# Patient Record
Sex: Female | Born: 1990 | Race: White | Hispanic: No | Marital: Single | Smoking: Current every day smoker
Health system: Southern US, Community
[De-identification: ages and names within clinical notes are randomized; demographics above are authoritative.]

## PROBLEM LIST (undated history)

## (undated) DIAGNOSIS — G479 Sleep disorder, unspecified: Secondary | ICD-10-CM

## (undated) DIAGNOSIS — G629 Polyneuropathy, unspecified: Secondary | ICD-10-CM

---
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
## 2010-11-30 ENCOUNTER — Emergency Department: Payer: Self-pay | Admitting: Internal Medicine

## 2013-06-17 ENCOUNTER — Emergency Department: Payer: Self-pay | Admitting: Emergency Medicine

## 2013-06-17 LAB — COMPREHENSIVE METABOLIC PANEL
Albumin: 3.6 g/dL (ref 3.4–5.0)
Alkaline Phosphatase: 52 U/L
Anion Gap: 5 — ABNORMAL LOW (ref 7–16)
BUN: 13 mg/dL (ref 7–18)
Bilirubin,Total: 0.1 mg/dL — ABNORMAL LOW (ref 0.2–1.0)
Calcium, Total: 9 mg/dL (ref 8.5–10.1)
Chloride: 108 mmol/L — ABNORMAL HIGH (ref 98–107)
Co2: 26 mmol/L (ref 21–32)
Creatinine: 0.78 mg/dL (ref 0.60–1.30)
EGFR (African American): >60
EGFR (Non-African Amer.): >60
Glucose: 94 mg/dL (ref 65–99)
Osmolality: 277 (ref 275–301)
Potassium: 3.6 mmol/L (ref 3.5–5.1)
SGOT(AST): 19 U/L (ref 15–37)
SGPT (ALT): 14 U/L (ref 12–78)
Sodium: 139 mmol/L (ref 136–145)
Total Protein: 7.4 g/dL (ref 6.4–8.2)

## 2013-06-17 LAB — URINALYSIS, COMPLETE
Bilirubin,UR: NEGATIVE
Glucose,UR: NEGATIVE mg/dL (ref 0–75)
Ketone: NEGATIVE
Leukocyte Esterase: NEGATIVE
NITRITE: POSITIVE
PROTEIN: NEGATIVE
Ph: 5 (ref 4.5–8.0)
Specific Gravity: 1.028 (ref 1.003–1.030)
WBC UR: 3 /HPF (ref 0–5)

## 2013-06-17 LAB — LIPASE, BLOOD: Lipase: 132 U/L (ref 73–393)

## 2013-06-17 LAB — CBC WITH DIFFERENTIAL/PLATELET
Basophil #: 0.1 10*3/uL (ref 0.0–0.1)
Basophil %: 0.6 %
Eosinophil #: 0.2 10*3/uL (ref 0.0–0.7)
Eosinophil %: 2 %
HCT: 41.8 % (ref 35.0–47.0)
HGB: 13.9 g/dL (ref 12.0–16.0)
Lymphocyte #: 3.4 10*3/uL (ref 1.0–3.6)
Lymphocyte %: 31.6 %
MCH: 29.6 pg (ref 26.0–34.0)
MCHC: 33.2 g/dL (ref 32.0–36.0)
MCV: 89 fL (ref 80–100)
Monocyte #: 0.8 x10 3/mm (ref 0.2–0.9)
Monocyte %: 7.3 %
Neutrophil #: 6.4 10*3/uL (ref 1.4–6.5)
Neutrophil %: 58.5 %
Platelet: 312 10*3/uL (ref 150–440)
RBC: 4.68 10*6/uL (ref 3.80–5.20)
RDW: 12.7 % (ref 11.5–14.5)
WBC: 10.9 10*3/uL (ref 3.6–11.0)

## 2013-06-20 LAB — URINE CULTURE

## 2013-12-14 ENCOUNTER — Emergency Department: Payer: Self-pay | Admitting: Emergency Medicine

## 2013-12-14 LAB — URINALYSIS, COMPLETE
Bacteria: NONE SEEN
RBC,UR: 2 /HPF (ref 0–5)
SPECIFIC GRAVITY: 1.029 (ref 1.003–1.030)
Squamous Epithelial: 3

## 2013-12-14 LAB — COMPREHENSIVE METABOLIC PANEL
ALBUMIN: 3.6 g/dL (ref 3.4–5.0)
ALK PHOS: 71 U/L
AST: 23 U/L (ref 15–37)
Anion Gap: 10 (ref 7–16)
BUN: 12 mg/dL (ref 7–18)
Bilirubin,Total: 0.3 mg/dL (ref 0.2–1.0)
Calcium, Total: 8.6 mg/dL (ref 8.5–10.1)
Chloride: 107 mmol/L (ref 98–107)
Co2: 24 mmol/L (ref 21–32)
Creatinine: 0.96 mg/dL (ref 0.60–1.30)
EGFR (African American): >60
Glucose: 85 mg/dL (ref 65–99)
Osmolality: 280 (ref 275–301)
Potassium: 3.7 mmol/L (ref 3.5–5.1)
SGPT (ALT): 22 U/L
Sodium: 141 mmol/L (ref 136–145)
TOTAL PROTEIN: 7.5 g/dL (ref 6.4–8.2)

## 2013-12-14 LAB — CBC
HCT: 41.8 % (ref 35.0–47.0)
HGB: 14.2 g/dL (ref 12.0–16.0)
MCH: 30.3 pg (ref 26.0–34.0)
MCHC: 34 g/dL (ref 32.0–36.0)
MCV: 89 fL (ref 80–100)
Platelet: 323 10*3/uL (ref 150–440)
RBC: 4.69 10*6/uL (ref 3.80–5.20)
RDW: 12.8 % (ref 11.5–14.5)
WBC: 15.3 10*3/uL — AB (ref 3.6–11.0)

## 2014-02-09 ENCOUNTER — Emergency Department: Payer: Self-pay | Admitting: Emergency Medicine

## 2014-02-09 LAB — CBC WITH DIFFERENTIAL/PLATELET
BASOS ABS: 0.1 10*3/uL (ref 0.0–0.1)
Basophil %: 0.5 %
Eosinophil #: 0.3 10*3/uL (ref 0.0–0.7)
Eosinophil %: 2.7 %
HCT: 41.7 % (ref 35.0–47.0)
HGB: 14.3 g/dL (ref 12.0–16.0)
Lymphocyte #: 3.7 10*3/uL — ABNORMAL HIGH (ref 1.0–3.6)
Lymphocyte %: 29.2 %
MCH: 30.5 pg (ref 26.0–34.0)
MCHC: 34.2 g/dL (ref 32.0–36.0)
MCV: 89 fL (ref 80–100)
MONO ABS: 0.8 x10 3/mm (ref 0.2–0.9)
MONOS PCT: 6.5 %
Neutrophil #: 7.8 10*3/uL — ABNORMAL HIGH (ref 1.4–6.5)
Neutrophil %: 61.1 %
PLATELETS: 340 10*3/uL (ref 150–440)
RBC: 4.68 10*6/uL (ref 3.80–5.20)
RDW: 12.7 % (ref 11.5–14.5)
WBC: 12.7 10*3/uL — AB (ref 3.6–11.0)

## 2014-02-09 LAB — COMPREHENSIVE METABOLIC PANEL
ALT: 26 U/L
Albumin: 3.4 g/dL (ref 3.4–5.0)
Alkaline Phosphatase: 66 U/L
Anion Gap: 9 (ref 7–16)
BILIRUBIN TOTAL: 0.3 mg/dL (ref 0.2–1.0)
BUN: 11 mg/dL (ref 7–18)
CO2: 26 mmol/L (ref 21–32)
Calcium, Total: 9 mg/dL (ref 8.5–10.1)
Chloride: 106 mmol/L (ref 98–107)
Creatinine: 0.69 mg/dL (ref 0.60–1.30)
EGFR (African American): >60
Glucose: 97 mg/dL (ref 65–99)
Osmolality: 281 (ref 275–301)
POTASSIUM: 3.6 mmol/L (ref 3.5–5.1)
SGOT(AST): 23 U/L (ref 15–37)
Sodium: 141 mmol/L (ref 136–145)
Total Protein: 7.5 g/dL (ref 6.4–8.2)

## 2014-02-09 LAB — URINALYSIS, COMPLETE
Bilirubin,UR: NEGATIVE
Glucose,UR: NEGATIVE mg/dL (ref 0–75)
Ketone: NEGATIVE
Leukocyte Esterase: NEGATIVE
Nitrite: NEGATIVE
PH: 6 (ref 4.5–8.0)
PROTEIN: NEGATIVE
RBC,UR: 1 /HPF (ref 0–5)
SPECIFIC GRAVITY: 1.02 (ref 1.003–1.030)
Squamous Epithelial: 17

## 2014-02-09 LAB — HCG, QUANTITATIVE, PREGNANCY: Beta Hcg, Quant.: 369 m[IU]/mL — ABNORMAL HIGH

## 2014-02-10 LAB — URINE CULTURE

## 2014-11-26 ENCOUNTER — Emergency Department: Payer: BLUE CROSS/BLUE SHIELD

## 2014-11-26 ENCOUNTER — Emergency Department
Admission: EM | Admit: 2014-11-26 | Discharge: 2014-11-26 | Disposition: A | Discharge status: Home / Self Care | Payer: BLUE CROSS/BLUE SHIELD | Attending: Emergency Medicine | Admitting: Emergency Medicine

## 2014-11-26 ENCOUNTER — Encounter: Payer: Self-pay | Admitting: *Deleted

## 2014-11-26 DIAGNOSIS — N390 Urinary tract infection, site not specified: Secondary | ICD-10-CM | POA: Insufficient documentation

## 2014-11-26 DIAGNOSIS — Z87891 Personal history of nicotine dependence: Secondary | ICD-10-CM | POA: Insufficient documentation

## 2014-11-26 DIAGNOSIS — Z3202 Encounter for pregnancy test, result negative: Secondary | ICD-10-CM | POA: Insufficient documentation

## 2014-11-26 DIAGNOSIS — N12 Tubulo-interstitial nephritis, not specified as acute or chronic: Secondary | ICD-10-CM | POA: Insufficient documentation

## 2014-11-26 LAB — BASIC METABOLIC PANEL
Anion gap: 5 (ref 5–15)
BUN: 12 mg/dL (ref 6–20)
CHLORIDE: 106 mmol/L (ref 101–111)
CO2: 29 mmol/L (ref 22–32)
CREATININE: 0.76 mg/dL (ref 0.44–1.00)
Calcium: 9 mg/dL (ref 8.9–10.3)
GFR calc Af Amer: >60 mL/min (ref >60)
GFR calc non Af Amer: >60 mL/min (ref >60)
GLUCOSE: 91 mg/dL (ref 65–99)
Potassium: 4.1 mmol/L (ref 3.5–5.1)
Sodium: 140 mmol/L (ref 135–145)

## 2014-11-26 LAB — CBC
HCT: 43.3 % (ref 35.0–47.0)
Hemoglobin: 14.6 g/dL (ref 12.0–16.0)
MCH: 29.4 pg (ref 26.0–34.0)
MCHC: 33.6 g/dL (ref 32.0–36.0)
MCV: 87.5 fL (ref 80.0–100.0)
PLATELETS: 264 10*3/uL (ref 150–440)
RBC: 4.95 MIL/uL (ref 3.80–5.20)
RDW: 12.9 % (ref 11.5–14.5)
WBC: 9.3 10*3/uL (ref 3.6–11.0)

## 2014-11-26 LAB — URINALYSIS COMPLETE WITH MICROSCOPIC (ARMC ONLY)
BILIRUBIN URINE: NEGATIVE
Bacteria, UA: NONE SEEN
Glucose, UA: NEGATIVE mg/dL
Ketones, ur: NEGATIVE mg/dL
NITRITE: NEGATIVE
Protein, ur: NEGATIVE mg/dL
Specific Gravity, Urine: 1.019 (ref 1.005–1.030)
pH: 5 (ref 5.0–8.0)

## 2014-11-26 LAB — POCT PREGNANCY, URINE: Preg Test, Ur: NEGATIVE

## 2014-11-26 MED ORDER — CEPHALEXIN 500 MG PO CAPS: 500.0000 mg | ORAL_CAPSULE | Freq: Three times a day (TID) | ORAL | Status: DC

## 2014-11-26 MED ORDER — ONDANSETRON 4 MG PO TBDP
4.0000 mg | ORAL_TABLET | Freq: Once | ORAL | Status: AC
  Administered 2014-11-26: 4 mg via ORAL

## 2014-11-26 MED ORDER — OXYCODONE-ACETAMINOPHEN 5-325 MG PO TABS: 1.0000 | ORAL_TABLET | Freq: Once | ORAL | Status: DC

## 2014-11-26 MED ORDER — OXYCODONE-ACETAMINOPHEN 5-325 MG PO TABS
ORAL_TABLET | ORAL | Status: AC
  Filled 2014-11-26: qty 2

## 2014-11-26 MED ORDER — OXYCODONE-ACETAMINOPHEN 5-325 MG PO TABS
2.0000 | ORAL_TABLET | Freq: Once | ORAL | Status: AC
  Administered 2014-11-26: 2 via ORAL

## 2014-11-26 MED ORDER — OXYCODONE-ACETAMINOPHEN 5-325 MG PO TABS
1.0000 | ORAL_TABLET | Freq: Once | ORAL | Status: AC
  Administered 2014-11-26: 1 via ORAL
  Filled 2014-11-26: qty 1

## 2014-11-26 MED ORDER — CEPHALEXIN 500 MG PO CAPS
500.0000 mg | ORAL_CAPSULE | Freq: Once | ORAL | Status: AC
  Administered 2014-11-26: 500 mg via ORAL
  Filled 2014-11-26 (×2): qty 1

## 2014-11-26 MED ORDER — ONDANSETRON 4 MG PO TBDP
ORAL_TABLET | ORAL | Status: AC
  Filled 2014-11-26: qty 1

## 2014-11-26 NOTE — ED Provider Notes (Signed)
[facility] Emergency Department Provider Note  ____________________________________________  Time seen: 1600  I have reviewed the triage vital signs and the nursing notes.   HISTORY  Chief Complaint Flank Pain     HPI Diana Eaton is a 24 y.o. female presents to the emergency department with right flank pain. She reports she started to have some dysuria on Sunday and some discomfort in her flank on Monday. She was seen yesterday at a "[facility]"at [employer] where she works. She was prescribed ciprofloxacin. She has taken 3 doses of this antibiotic, but she reports her pain has been getting worse, notably at around 9:30 this morning.  She denies any nausea or diarrhea. She denies any fever. She does have a history of kidney stones, notably last tear. She reports she was seen at [facility] and it sounds like she had a CT scan at that time.    Past medical history: Renal stones  There are no active problems to display for this patient.   Surgical history: Status post cholecystectomy and knee surgery  Current Outpatient Rx  Name  Route  Sig  Dispense  Refill  . cephALEXin (KEFLEX) 500 MG capsule   Oral   Take 1 capsule (500 mg total) by mouth 3 (three) times daily.   21 capsule   0   . oxyCODONE-acetaminophen (PERCOCET/ROXICET) 5-325 MG tablet   Oral   Take 1 tablet by mouth once.   10 tablet   0     Allergies Morphine and related  No family history on file.  Social History Social History  Substance Use Topics  . Smoking status: Former Games developer  . Smokeless tobacco: None  . Alcohol Use: No    Review of Systems  Constitutional: Negative for fever. ENT: Negative for sore throat. Cardiovascular: Negative for chest pain. Respiratory: Negative for cough. Gastrointestinal: Right flank pain. See history of present illness. Genitourinary: Positive for dysuria. See history of present illness Musculoskeletal: No myalgias or injuries. Skin:  Negative for rash. Neurological: Negative for paresthesia or weakness   10-point ROS otherwise negative.  ____________________________________________   PHYSICAL EXAM:  VITAL SIGNS: ED Triage Vitals  Enc Vitals Group     BP 11/26/14 1505 148/101 mmHg     Pulse Rate 11/26/14 1505 87     Resp 11/26/14 1505 20     Temp 11/26/14 1505 97.8 F (36.6 C)     Temp Source 11/26/14 1505 Oral     SpO2 11/26/14 1505 99 %     Weight 11/26/14 1505 285 lb (129.275 kg)     Height 11/26/14 1505  (1.778 m)     Head Cir --      Peak Flow --      Pain Score 11/26/14 1506 8     Pain Loc --      Pain Edu? --      Excl. in GC? --     Constitutional: Alert and oriented. Appears uncomfortable with some discomfort with movement.Marland Kitchen ENT   Head: Normocephalic and atraumatic.   Nose: No congestion/rhinnorhea.    Cardiovascular: Normal rate, regular rhythm, no murmur noted Respiratory:  Normal respiratory effort, no tachypnea.    Breath sounds are clear and equal bilaterally.  Gastrointestinal: Tenderness in the right abdomen, both upper and lower, but worse in the lower area with tenderness near McBurney's point. Abdomen is soft without distention. Normal bowel sounds.  Back: Pain in right paraspinal muscles, lower thoracic and upper lumbar area. Some pain  with movement. No pain radiating into the buttocks or leg.. Musculoskeletal: No deformity noted. Nontender with normal range of motion in all extremities.  No noted edema. Neurologic:  Normal speech and language. No gross focal neurologic deficits are appreciated.  Skin:  Skin is warm, dry. No rash noted. Psychiatric: Mood and affect are normal. Speech and behavior are normal.  ____________________________________________    LABS (pertinent positives/negatives)  Labs Reviewed  URINALYSIS COMPLETEWITH MICROSCOPIC ([facility] ONLY) - Abnormal; Notable for the following:    Color, Urine YELLOW (*)    APPearance CLOUDY (*)    Hgb urine  dipstick 3+ (*)    Leukocytes, UA TRACE (*)    Squamous Epithelial / LPF 6-30 (*)    All other components within normal limits  URINE CULTURE  BASIC METABOLIC PANEL  CBC  POC URINE PREG, ED  POCT PREGNANCY, URINE     ____________________________________________    RADIOLOGY  CT abdomen and pelvis:  IMPRESSION: 1. There is no nephrolithiasis. No hydronephrosis or hydroureter. 2. No calcified ureteral calculi. 3. Normal appendix. No pericecal inflammation. 4. No small bowel obstruction. 5. Status post cholecystectomy.  ____________________________________________   ____________________________________________   INITIAL IMPRESSION / ASSESSMENT AND PLAN / ED COURSE  Pertinent labs & imaging results that were available during my care of the patient were reviewed by me and considered in my medical decision making (see chart for details).  Pleasant alert 24 year old female who appears to be in discomfort. The exam shows muscular tenderness in the right paraspinal muscles, but also abdominal tenderness, most tender and exquisite over McBurney's point. The differential diagnosis includes renal stone, renal infection, or appendicitis. The patient and I have discussed options and agreed that a CT scan is appropriate. We will perform this as a renal stone protocol.  I will treat her pain currently with oral medications.  ----------------------------------------- 5:39 PM on 11/26/2014 -----------------------------------------  CT scan does not show any nephrolithiasis. She has a normal-appearing appendix. The urine does show white blood cells too numerous to count and red blood cells too numerous to count. Her renal function is normal and her white blood cell count is normal at 9.3  Given the overall normal CT scan and lab values, I suspect the patient has a mild pyelonephritis. We will culture the urine and had on Keflex for broad coverage. I advised that she needs to follow-up  with a urologist given the possible complexity of her condition.  ____________________________________________   FINAL CLINICAL IMPRESSION(S) / ED DIAGNOSES  Final diagnoses:  UTI (lower urinary tract infection)  Pyelonephritis      Darien Ramusavid W Samani Deal, MD 11/26/14 1745

## 2014-11-26 NOTE — ED Notes (Signed)
Pt is currently being treated for UTI. States she has had three doses of Cipro. Reports flank pain is worse than yesterday.

## 2014-11-26 NOTE — ED Notes (Signed)
poct pregnancy Negative 

## 2014-11-26 NOTE — Discharge Instructions (Signed)
Discharges blower for a kidney infection. It is unclear if your infection is limited the bladder or his climbing higher to the kidney. Your CT scan was okay and did not show any inflammation consistent with a bad kidney infection. Your CT also was negative for a kidney stone and negative for appendicitis. We are adding to the antibiotics are currently taking. Take Keflex as well as the ciprofloxacin that you are currently taking. Your urine shows many many white blood cells and many many red blood cells. Follow-up with urology or your regular doctor. Return to the emergency department if you have fever, worsening pain, or other urgent concerns.  Pyelonephritis, Adult Pyelonephritis is a kidney infection. The kidneys are organs that help clean your blood by moving waste out of your blood and into your pee (urine). This infection can happen quickly, or it can last for a long time. In most cases, it clears up with treatment and does not cause other problems. HOME CARE Medicines  Take over-the-counter and prescription medicines only as told by your doctor.  Take your antibiotic medicine as told by your doctor. Do not stop taking the medicine even if you start to feel better. General Instructions  Drink enough fluid to keep your pee clear or pale yellow.  Avoid caffeine, tea, and carbonated drinks.  Pee (urinate) often. Avoid holding in pee for long periods of time.  Pee before and after sex.  After pooping (having a bowel movement), women should wipe from front to back. Use each tissue only once.  Keep all follow-up visits as told by your doctor. This is important. GET HELP IF:  You do not feel better after 2 days.  Your symptoms get worse.  You have a fever. GET HELP RIGHT AWAY IF:  You cannot take your medicine or drink fluids as told.  You have chills and shaking.  You throw up (vomit).  You have very bad pain in your side (flank) or back.  You feel very weak or you pass out  (faint).   This information is not intended to replace advice given to you by your health care provider. Make sure you discuss any questions you have with your health care provider.   Document Released: 03/10/2004 Document Revised: 10/22/2014 Document Reviewed: 05/26/2014 Elsevier Interactive Patient Education [employer]2016 Elsevier Inc.

## 2014-11-26 NOTE — ED Notes (Addendum)
Pt has pain in right flank for 4 days.  Hx of kidney stones.  No n/v/d.  Taking percocet for pain last night, none today.  Seen by a provider yesterday, dx with uti and now on cipro.

## 2014-11-30 LAB — URINE CULTURE: CULTURE: NO GROWTH

## 2015-05-07 ENCOUNTER — Encounter: Payer: Self-pay | Admitting: Emergency Medicine

## 2015-05-07 ENCOUNTER — Emergency Department: Payer: 59

## 2015-05-07 ENCOUNTER — Emergency Department
Admission: EM | Admit: 2015-05-07 | Discharge: 2015-05-07 | Disposition: A | Discharge status: Home / Self Care | Payer: 59 | Attending: Emergency Medicine | Admitting: Emergency Medicine

## 2015-05-07 DIAGNOSIS — B9689 Other specified bacterial agents as the cause of diseases classified elsewhere: Secondary | ICD-10-CM

## 2015-05-07 DIAGNOSIS — N760 Acute vaginitis: Secondary | ICD-10-CM | POA: Insufficient documentation

## 2015-05-07 DIAGNOSIS — Z3202 Encounter for pregnancy test, result negative: Secondary | ICD-10-CM | POA: Insufficient documentation

## 2015-05-07 DIAGNOSIS — Z792 Long term (current) use of antibiotics: Secondary | ICD-10-CM | POA: Diagnosis not present

## 2015-05-07 DIAGNOSIS — R1032 Left lower quadrant pain: Secondary | ICD-10-CM

## 2015-05-07 DIAGNOSIS — F17200 Nicotine dependence, unspecified, uncomplicated: Secondary | ICD-10-CM | POA: Insufficient documentation

## 2015-05-07 DIAGNOSIS — R1031 Right lower quadrant pain: Secondary | ICD-10-CM

## 2015-05-07 DIAGNOSIS — R102 Pelvic and perineal pain: Secondary | ICD-10-CM | POA: Diagnosis present

## 2015-05-07 HISTORY — DX: Sleep disorder, unspecified: G47.9

## 2015-05-07 HISTORY — DX: Polyneuropathy, unspecified: G62.9

## 2015-05-07 LAB — URINALYSIS COMPLETE WITH MICROSCOPIC (ARMC ONLY)
Bilirubin Urine: NEGATIVE
GLUCOSE, UA: NEGATIVE mg/dL
Ketones, ur: NEGATIVE mg/dL
Leukocytes, UA: NEGATIVE
NITRITE: NEGATIVE
Protein, ur: NEGATIVE mg/dL
SPECIFIC GRAVITY, URINE: 1.023 (ref 1.005–1.030)
pH: 5 (ref 5.0–8.0)

## 2015-05-07 LAB — CHLAMYDIA/NGC RT PCR (ARMC ONLY)
Chlamydia Tr: NOT DETECTED
N GONORRHOEAE: NOT DETECTED

## 2015-05-07 LAB — CBC
HEMATOCRIT: 41.1 % (ref 35.0–47.0)
Hemoglobin: 14.4 g/dL (ref 12.0–16.0)
MCH: 30.3 pg (ref 26.0–34.0)
MCHC: 35 g/dL (ref 32.0–36.0)
MCV: 86.5 fL (ref 80.0–100.0)
Platelets: 294 10*3/uL (ref 150–440)
RBC: 4.75 MIL/uL (ref 3.80–5.20)
RDW: 13.1 % (ref 11.5–14.5)
WBC: 10.7 10*3/uL (ref 3.6–11.0)

## 2015-05-07 LAB — COMPREHENSIVE METABOLIC PANEL
ALBUMIN: 4.2 g/dL (ref 3.5–5.0)
ALK PHOS: 46 U/L (ref 38–126)
ALT: 16 U/L (ref 14–54)
AST: 27 U/L (ref 15–41)
Anion gap: 8 (ref 5–15)
BILIRUBIN TOTAL: 0.5 mg/dL (ref 0.3–1.2)
BUN: 13 mg/dL (ref 6–20)
CALCIUM: 9.1 mg/dL (ref 8.9–10.3)
CO2: 21 mmol/L — ABNORMAL LOW (ref 22–32)
CREATININE: 0.72 mg/dL (ref 0.44–1.00)
Chloride: 108 mmol/L (ref 101–111)
GFR calc Af Amer: >60 mL/min (ref >60)
GFR calc non Af Amer: >60 mL/min (ref >60)
GLUCOSE: 122 mg/dL — AB (ref 65–99)
POTASSIUM: 3.5 mmol/L (ref 3.5–5.1)
Sodium: 137 mmol/L (ref 135–145)
TOTAL PROTEIN: 7.5 g/dL (ref 6.5–8.1)

## 2015-05-07 LAB — WET PREP, GENITAL
Sperm: NONE SEEN
Trich, Wet Prep: NONE SEEN
YEAST WET PREP: NONE SEEN

## 2015-05-07 LAB — LIPASE, BLOOD: Lipase: 29 U/L (ref 11–51)

## 2015-05-07 LAB — POCT PREGNANCY, URINE: PREG TEST UR: NEGATIVE

## 2015-05-07 MED ORDER — METRONIDAZOLE 500 MG PO TABS: 500.0000 mg | ORAL_TABLET | Freq: Two times a day (BID) | ORAL | Status: DC

## 2015-05-07 MED ORDER — HYDROMORPHONE HCL 1 MG/ML IJ SOLN
INTRAMUSCULAR | Status: AC
Start: 2015-05-07 — End: 2015-05-07
  Administered 2015-05-07: 1 mg via INTRAVENOUS
  Filled 2015-05-07: qty 1

## 2015-05-07 MED ORDER — HYDROMORPHONE HCL 1 MG/ML IJ SOLN
1.0000 mg | Freq: Once | INTRAMUSCULAR | Status: AC
  Administered 2015-05-07: 1 mg via INTRAVENOUS

## 2015-05-07 MED ORDER — HYDROMORPHONE HCL 1 MG/ML IJ SOLN
INTRAMUSCULAR | Status: AC
  Administered 2015-05-07: 1 mg via INTRAVENOUS
  Filled 2015-05-07: qty 1

## 2015-05-07 MED ORDER — KETOROLAC TROMETHAMINE 30 MG/ML IJ SOLN
INTRAMUSCULAR | Status: AC
  Administered 2015-05-07: 30 mg via INTRAVENOUS
  Filled 2015-05-07: qty 1

## 2015-05-07 MED ORDER — OXYCODONE-ACETAMINOPHEN 5-325 MG PO TABS
1.0000 | ORAL_TABLET | Freq: Once | ORAL | Status: AC
  Administered 2015-05-07: 1 via ORAL
  Filled 2015-05-07: qty 1

## 2015-05-07 MED ORDER — DIATRIZOATE MEGLUMINE & SODIUM 66-10 % PO SOLN
25.0000 mL | Freq: Once | ORAL | Status: AC
  Administered 2015-05-07: 15 mL via ORAL

## 2015-05-07 MED ORDER — KETOROLAC TROMETHAMINE 30 MG/ML IJ SOLN
30.0000 mg | Freq: Once | INTRAMUSCULAR | Status: AC
  Administered 2015-05-07: 30 mg via INTRAVENOUS

## 2015-05-07 MED ORDER — OXYCODONE-ACETAMINOPHEN 5-325 MG PO TABS: 1.0000 | ORAL_TABLET | Freq: Four times a day (QID) | ORAL | Status: DC | PRN

## 2015-05-07 MED ORDER — METRONIDAZOLE 500 MG PO TABS
500.0000 mg | ORAL_TABLET | Freq: Once | ORAL | Status: AC
  Administered 2015-05-07: 500 mg via ORAL
  Filled 2015-05-07: qty 1

## 2015-05-07 MED ORDER — ONDANSETRON 8 MG PO TBDP
8.0000 mg | ORAL_TABLET | Freq: Three times a day (TID) | ORAL | Status: DC | PRN
Start: 2015-05-07 — End: 2019-01-14

## 2015-05-07 MED ORDER — IOPAMIDOL (ISOVUE-300) INJECTION 61%
100.0000 mL | Freq: Once | INTRAVENOUS | Status: AC | PRN
  Administered 2015-05-07: 100 mL via INTRAVENOUS
  Filled 2015-05-07: qty 100

## 2015-05-07 MED ORDER — SODIUM CHLORIDE 0.9 % IV BOLUS (SEPSIS)
1000.0000 mL | Freq: Once | INTRAVENOUS | Status: AC
  Administered 2015-05-07: 1000 mL via INTRAVENOUS

## 2015-05-07 NOTE — ED Notes (Signed)
Patient transported to Ultrasound 

## 2015-05-07 NOTE — ED Notes (Signed)
Patient transported to CT 

## 2015-05-07 NOTE — ED Provider Notes (Signed)
[facility] Emergency Department Provider Note  ____________________________________________  Time seen: 6:40 PM  I have reviewed the triage vital signs and the nursing notes.   HISTORY  Chief Complaint Pelvic Pain    HPI Diana Eaton is a 25 y.o. female who complains of severe right-sided pelvic pain for the past 4 days. No nausea vomiting or diarrhea. She hasn't had some vaginal discharge for the past 2 days with some light bleeding. Pain is been constant and gradually worsening. Nonradiating. No aggravating or alleviating factors although it does hurt with she moves. History of cholecystectomy. Was recently completed a course of Keflex.Pain is sharp and severe.     Past Medical History  Diagnosis Date  . Neuropathy (HCC)   . Sleep disturbance      There are no active problems to display for this patient.    History reviewed. No pertinent past surgical history. Cholecystectomy  Current Outpatient Rx  Name  Route  Sig  Dispense  Refill  . cephALEXin (KEFLEX) 500 MG capsule   Oral   Take 1 capsule (500 mg total) by mouth 3 (three) times daily.   21 capsule   0   . oxyCODONE-acetaminophen (PERCOCET/ROXICET) 5-325 MG tablet   Oral   Take 1 tablet by mouth once.   10 tablet   0      Allergies Morphine and related   History reviewed. No pertinent family history.  Social History Social History  Substance Use Topics  . Smoking status: Current Every Day Smoker  . Smokeless tobacco: None  . Alcohol Use: No    Review of Systems  Constitutional:   No fever or chills. No weight changes Eyes:   No vision changes.  ENT:   No sore throat. No rhinorrhea. Cardiovascular:   No chest pain. Respiratory:   No dyspnea or cough. Gastrointestinal:   Positive right lower quadrant abdominal pain.Marland Kitchen.  No BRBPR or melena. Genitourinary:   Negative for dysuria or difficulty urinating. Musculoskeletal:   Negative for focal pain or  swelling Skin:   Negative for rash. Neurological:   Negative for headaches, focal weakness or numbness.  10-point ROS otherwise negative.  ____________________________________________   PHYSICAL EXAM:  VITAL SIGNS: ED Triage Vitals  Enc Vitals Group     BP 05/07/15 1427 127/91 mmHg     Pulse Rate 05/07/15 1427 97     Resp 05/07/15 1427 18     Temp 05/07/15 1427 98.7 F (37.1 C)     Temp Source 05/07/15 1427 Oral     SpO2 05/07/15 1427 96 %     Weight 05/07/15 1427 295 lb (133.811 kg)     Height 05/07/15 1427 5\' 10"  (1.778 m)     Head Cir --      Peak Flow --      Pain Score 05/07/15 1427 7     Pain Loc --      Pain Edu? --      Excl. in GC? --     Vital signs reviewed, nursing assessments reviewed.   Constitutional:   Alert and oriented. Well appearing and in no distress. Eyes:   No scleral icterus. No conjunctival pallor. PERRL. EOMI ENT   Head:   Normocephalic and atraumatic.   Nose:   No congestion/rhinnorhea. No septal hematoma   Mouth/Throat:   MMM, no pharyngeal erythema. No peritonsillar mass.    Neck:   No stridor. No SubQ emphysema. No meningismus. Hematological/Lymphatic/Immunilogical:   No cervical lymphadenopathy. Cardiovascular:  RRR. Symmetric bilateral radial and DP pulses.  No murmurs.  Respiratory:   Normal respiratory effort without tachypnea nor retractions. Breath sounds are clear and equal bilaterally. No wheezes/rales/rhonchi. Gastrointestinal:   Soft with severe right lower quadrant abdominal tenderness. There is some voluntary guarding. The area does feel full to palpation.. Non distended. There is no CVA tenderness.  No rebound or rigidity.. Genitourinary:   Pelvic exam performed with nursing assistant Mardene Celeste. External exam unremarkable. Speculum exam does show some brownish discharge. Bimanual exam negative for CMT. Cervix and uterus nontender. No adnexal tenderness. Musculoskeletal:   Nontender with normal range of motion in all  extremities. No joint effusions.  No lower extremity tenderness.  No edema. Neurologic:   Normal speech and language.  CN 2-10 normal. Motor grossly intact. No gross focal neurologic deficits are appreciated.  Skin:    Skin is warm, dry and intact. No rash noted.  No petechiae, purpura, or bullae. Psychiatric:   Mood and affect are normal. ____________________________________________    LABS (pertinent positives/negatives) (all labs ordered are listed, but only abnormal results are displayed) Labs Reviewed  WET PREP, GENITAL - Abnormal; Notable for the following:    Clue Cells Wet Prep HPF POC PRESENT (*)    WBC, Wet Prep HPF POC FEW (*)    All other components within normal limits  COMPREHENSIVE METABOLIC PANEL - Abnormal; Notable for the following:    CO2 21 (*)    Glucose, Bld 122 (*)    All other components within normal limits  URINALYSIS COMPLETEWITH MICROSCOPIC ([facility] ONLY) - Abnormal; Notable for the following:    Color, Urine YELLOW (*)    APPearance HAZY (*)    Hgb urine dipstick 2+ (*)    Bacteria, UA RARE (*)    Squamous Epithelial / LPF 6-30 (*)    All other components within normal limits  CHLAMYDIA/NGC RT PCR ([facility] ONLY)  LIPASE, BLOOD  CBC  POC URINE PREG, ED  POCT PREGNANCY, URINE   ____________________________________________   EKG    ____________________________________________    RADIOLOGY CT abdomen and pelvis unremarkable Ultrasound pelvis unremarkable, negative for torsion.  ____________________________________________   PROCEDURES   ____________________________________________   INITIAL IMPRESSION / ASSESSMENT AND PLAN / ED COURSE  Pertinent labs & imaging results that were available during my care of the patient were reviewed by me and considered in my medical decision making (see chart for details).  Patient presents with severe right lower quadrant abdominal pain. History not consistent with torsion so he proceeded to  work this up as an appendicitis. However CT was negative. Pelvic exam unremarkable but wet prep does show evidence of bacterial vaginosis. As this would be very severe symptoms for BV, proceeded with a ultrasound to evaluate for ovarian cyst or torsion although no evidence of this was seen on CT.   ----------------------------------------- 10:43 PM on 05/07/2015 -----------------------------------------  Well-appearing, symptoms controlled, vital signs stable. Workup negative, ultrasound negative. Looks like this is just severe symptoms of bacterial vaginosis. We'll give the patient a Percocet of Flagyl for now, prescribed the same, follow-up with primary care. Low suspicion for PID.    ____________________________________________   FINAL CLINICAL IMPRESSION(S) / ED DIAGNOSES  Final diagnoses:  LLQ pain   right lower quadrant abdominal pain Bacterial vaginosis    Sharman Cheek, MD 05/07/15 2243

## 2015-05-07 NOTE — ED Notes (Addendum)
Pt to ed with c/o right pelvic pain since Sunday.  Pt denies n/v/d.  Pt states vaginal d/c 2 days ago, clear and mucus.

## 2015-05-07 NOTE — Discharge Instructions (Signed)
You were prescribed a medication that is potentially sedating. Do not drink alcohol, drive or participate in any other potentially dangerous activities while taking this medication as it may make you sleepy. Do not take this medication with any other sedating medications, either prescription or over-the-counter. If you were prescribed Percocet or Vicodin, do not take these with acetaminophen (Tylenol) as it is already contained within these medications.   Opioid pain medications (or "narcotics") can be habit forming.  Use it as little as possible to achieve adequate pain control.  Do not use or use it with extreme caution if you have a history of opiate abuse or dependence.  If you are on a pain contract with your primary care doctor or a pain specialist, be sure to let them know you were prescribed this medication today from the [facility] Emergency Department.  This medication is intended for your use only - do not give any to anyone else and keep it in a secure place where nobody else, especially children and pets, have access to it.  It will also cause or worsen constipation, so you may want to consider taking an over-the-counter stool softener while you are taking this medication.  Abdominal Pain, Adult Many things can cause abdominal pain. Usually, abdominal pain is not caused by a disease and will improve without treatment. It can often be observed and treated at home. Your health care provider will do a physical exam and possibly order blood tests and X-rays to help determine the seriousness of your pain. However, in many cases, more time must pass before a clear cause of the pain can be found. Before that point, your health care provider may not know if you need more testing or further treatment. HOME CARE INSTRUCTIONS Monitor your abdominal pain for any changes. The following actions may help to alleviate any discomfort you are experiencing:  Only take over-the-counter or prescription  medicines as directed by your health care provider.  Do not take laxatives unless directed to do so by your health care provider.  Try a clear liquid diet (broth, tea, or water) as directed by your health care provider. Slowly move to a bland diet as tolerated. SEEK MEDICAL CARE IF:  You have unexplained abdominal pain.  You have abdominal pain associated with nausea or diarrhea.  You have pain when you urinate or have a bowel movement.  You experience abdominal pain that wakes you in the night.  You have abdominal pain that is worsened or improved by eating food.  You have abdominal pain that is worsened with eating fatty foods.  You have a fever. SEEK IMMEDIATE MEDICAL CARE IF:  Your pain does not go away within 2 hours.  You keep throwing up (vomiting).  Your pain is felt only in portions of the abdomen, such as the right side or the left lower portion of the abdomen.  You pass bloody or black tarry stools. MAKE SURE YOU:  Understand these instructions.  Will watch your condition.  Will get help right away if you are not doing well or get worse.   This information is not intended to replace advice given to you by your health care provider. Make sure you discuss any questions you have with your health care provider.   Document Released: 11/10/2004 Document Revised: 10/22/2014 Document Reviewed: 10/10/2012 Elsevier Interactive Patient Education 2016 Elsevier Inc.  Bacterial Vaginosis Bacterial vaginosis is a vaginal infection that occurs when the normal balance of bacteria in the vagina is disrupted.  It results from an overgrowth of certain bacteria. This is the most common vaginal infection in women of childbearing age. Treatment is important to prevent complications, especially in pregnant women, as it can cause a premature delivery. CAUSES  Bacterial vaginosis is caused by an increase in harmful bacteria that are normally present in smaller amounts in the vagina.  Several different kinds of bacteria can cause bacterial vaginosis. However, the reason that the condition develops is not fully understood. RISK FACTORS Certain activities or behaviors can put you at an increased risk of developing bacterial vaginosis, including:  Having a new sex partner or multiple sex partners.  Douching.  Using an intrauterine device (IUD) for contraception. Women do not get bacterial vaginosis from toilet seats, bedding, swimming pools, or contact with objects around them. SIGNS AND SYMPTOMS  Some women with bacterial vaginosis have no signs or symptoms. Common symptoms include:  Grey vaginal discharge.  A fishlike odor with discharge, especially after sexual intercourse.  Itching or burning of the vagina and vulva.  Burning or pain with urination. DIAGNOSIS  Your health care provider will take a medical history and examine the vagina for signs of bacterial vaginosis. A sample of vaginal fluid may be taken. Your health care provider will look at this sample under a microscope to check for bacteria and abnormal cells. A vaginal pH test may also be done.  TREATMENT  Bacterial vaginosis may be treated with antibiotic medicines. These may be given in the form of a pill or a vaginal cream. A second round of antibiotics may be prescribed if the condition comes back after treatment. Because bacterial vaginosis increases your risk for sexually transmitted diseases, getting treated can help reduce your risk for chlamydia, gonorrhea, HIV, and herpes. HOME CARE INSTRUCTIONS   Only take over-the-counter or prescription medicines as directed by your health care provider.  If antibiotic medicine was prescribed, take it as directed. Make sure you finish it even if you start to feel better.  Tell all sexual partners that you have a vaginal infection. They should see their health care provider and be treated if they have problems, such as a mild rash or itching.  During  treatment, it is important that you follow these instructions:  Avoid sexual activity or use condoms correctly.  Do not douche.  Avoid alcohol as directed by your health care provider.  Avoid breastfeeding as directed by your health care provider. SEEK MEDICAL CARE IF:   Your symptoms are not improving after 3 days of treatment.  You have increased discharge or pain.  You have a fever. MAKE SURE YOU:   Understand these instructions.  Will watch your condition.  Will get help right away if you are not doing well or get worse. FOR MORE INFORMATION  Centers for Disease Control and Prevention, Division of STD Prevention: [employer]Apps.co.zawww.cdc.gov/std American Sexual Health Association (ASHA): www.ashastd.org    This information is not intended to replace advice given to you by your health care provider. Make sure you discuss any questions you have with your health care provider.   Document Released: 01/31/2005 Document Revised: 02/21/2014 Document Reviewed: 09/12/2012 Elsevier Interactive Patient Education [employer] Elsevier Inc.

## 2015-12-01 ENCOUNTER — Encounter: Payer: Self-pay | Admitting: Emergency Medicine

## 2015-12-01 ENCOUNTER — Emergency Department
Admission: EM | Admit: 2015-12-01 | Discharge: 2015-12-01 | Disposition: A | Discharge status: Home / Self Care | Payer: 59 | Attending: Emergency Medicine | Admitting: Emergency Medicine

## 2015-12-01 DIAGNOSIS — F17200 Nicotine dependence, unspecified, uncomplicated: Secondary | ICD-10-CM | POA: Diagnosis not present

## 2015-12-01 DIAGNOSIS — Z5321 Procedure and treatment not carried out due to patient leaving prior to being seen by health care provider: Secondary | ICD-10-CM | POA: Diagnosis not present

## 2015-12-01 DIAGNOSIS — Z79899 Other long term (current) drug therapy: Secondary | ICD-10-CM | POA: Diagnosis not present

## 2015-12-01 DIAGNOSIS — R1030 Lower abdominal pain, unspecified: Secondary | ICD-10-CM | POA: Insufficient documentation

## 2015-12-01 LAB — COMPREHENSIVE METABOLIC PANEL
ALBUMIN: 4.3 g/dL (ref 3.5–5.0)
ALT: 43 U/L (ref 14–54)
AST: 36 U/L (ref 15–41)
Alkaline Phosphatase: 57 U/L (ref 38–126)
Anion gap: 7 (ref 5–15)
BUN: 14 mg/dL (ref 6–20)
CALCIUM: 9.1 mg/dL (ref 8.9–10.3)
CHLORIDE: 106 mmol/L (ref 101–111)
CO2: 27 mmol/L (ref 22–32)
CREATININE: 0.82 mg/dL (ref 0.44–1.00)
GFR calc Af Amer: >60 mL/min (ref >60)
GLUCOSE: 98 mg/dL (ref 65–99)
POTASSIUM: 3.4 mmol/L — AB (ref 3.5–5.1)
Sodium: 140 mmol/L (ref 135–145)
TOTAL PROTEIN: 7.7 g/dL (ref 6.5–8.1)
Total Bilirubin: 0.9 mg/dL (ref 0.3–1.2)

## 2015-12-01 LAB — POCT PREGNANCY, URINE: Preg Test, Ur: NEGATIVE

## 2015-12-01 LAB — LIPASE, BLOOD: LIPASE: 27 U/L (ref 11–51)

## 2015-12-01 LAB — CBC
HCT: 46.5 % (ref 35.0–47.0)
Hemoglobin: 15.6 g/dL (ref 12.0–16.0)
MCH: 29.4 pg (ref 26.0–34.0)
MCHC: 33.5 g/dL (ref 32.0–36.0)
MCV: 87.9 fL (ref 80.0–100.0)
PLATELETS: 301 10*3/uL (ref 150–440)
RBC: 5.3 MIL/uL — AB (ref 3.80–5.20)
RDW: 13.6 % (ref 11.5–14.5)
WBC: 11.2 10*3/uL — ABNORMAL HIGH (ref 3.6–11.0)

## 2015-12-01 LAB — URINALYSIS COMPLETE WITH MICROSCOPIC (ARMC ONLY)
BILIRUBIN URINE: NEGATIVE
Bacteria, UA: NONE SEEN
Glucose, UA: NEGATIVE mg/dL
Ketones, ur: NEGATIVE mg/dL
Leukocytes, UA: NEGATIVE
NITRITE: NEGATIVE
Protein, ur: NEGATIVE mg/dL
SPECIFIC GRAVITY, URINE: 1.026 (ref 1.005–1.030)
pH: 5 (ref 5.0–8.0)

## 2015-12-01 NOTE — ED Triage Notes (Signed)
Pt ambulatory to triage with steady gait, no distress noted. Pt reports she was seen last week, diagnosed with UTI, finished meds on 11/30/15. Pt sts she is now having lower abdomin pain that radiates into her back, urinary frequency, dark colored urine.

## 2015-12-02 ENCOUNTER — Telehealth: Payer: Self-pay | Admitting: Emergency Medicine

## 2015-12-02 NOTE — Telephone Encounter (Signed)
Called patient due to lwot to inquire about condition and follow up plans. Left message.   

## 2016-12-22 ENCOUNTER — Emergency Department: Payer: 59

## 2016-12-22 ENCOUNTER — Encounter: Payer: Self-pay | Admitting: Emergency Medicine

## 2016-12-22 ENCOUNTER — Emergency Department
Admission: EM | Admit: 2016-12-22 | Discharge: 2016-12-22 | Disposition: A | Discharge status: Home / Self Care | Payer: 59 | Attending: Emergency Medicine | Admitting: Emergency Medicine

## 2016-12-22 ENCOUNTER — Other Ambulatory Visit: Payer: Self-pay

## 2016-12-22 DIAGNOSIS — Y999 Unspecified external cause status: Secondary | ICD-10-CM | POA: Insufficient documentation

## 2016-12-22 DIAGNOSIS — Y939 Activity, unspecified: Secondary | ICD-10-CM | POA: Insufficient documentation

## 2016-12-22 DIAGNOSIS — Y929 Unspecified place or not applicable: Secondary | ICD-10-CM | POA: Insufficient documentation

## 2016-12-22 DIAGNOSIS — F17200 Nicotine dependence, unspecified, uncomplicated: Secondary | ICD-10-CM | POA: Insufficient documentation

## 2016-12-22 DIAGNOSIS — S9031XA Contusion of right foot, initial encounter: Secondary | ICD-10-CM | POA: Diagnosis not present

## 2016-12-22 DIAGNOSIS — Z79899 Other long term (current) drug therapy: Secondary | ICD-10-CM | POA: Diagnosis not present

## 2016-12-22 DIAGNOSIS — W010XXA Fall on same level from slipping, tripping and stumbling without subsequent striking against object, initial encounter: Secondary | ICD-10-CM | POA: Insufficient documentation

## 2016-12-22 DIAGNOSIS — S99921A Unspecified injury of right foot, initial encounter: Secondary | ICD-10-CM | POA: Diagnosis present

## 2016-12-22 MED ORDER — MELOXICAM 15 MG PO TABS: 15.0000 mg | ORAL_TABLET | Freq: Every day | ORAL | 1 refills | Status: AC

## 2016-12-22 NOTE — ED Provider Notes (Signed)
[facility] Emergency Department Provider Note  ____________________________________________  Time seen: Approximately 4:07 PM  I have reviewed the triage vital signs and the nursing notes.   HISTORY  Chief Complaint Foot Injury    HPI Diana Eaton is a 26 y.o. female presents to the emergency department with acute 10 out of 10 right foot pain restricted to the first through fifth metatarsals after patient tripped over her dog.  Patient did not hit her head or lose consciousness.  She denies weakness, radiculopathy or changes in sensation of the lower extremities.  Patient reports increased pain with bearing weight and pain relief with a supine position.  No medications were attempted prior to presenting to the emergency department.   Past Medical History:  Diagnosis Date  . Neuropathy   . Sleep disturbance     There are no active problems to display for this patient.   History reviewed. No pertinent surgical history.  Prior to Admission medications   Medication Sig Start Date End Date Taking? Authorizing Provider  cephALEXin (KEFLEX) 500 MG capsule Take 1 capsule (500 mg total) by mouth 3 (three) times daily. 11/26/14   Darien RamusKaminski, David W, MD  meloxicam (MOBIC) 15 MG tablet Take 1 tablet (15 mg total) daily for 7 days by mouth. 12/22/16 12/29/16  Orvil FeilWoods, Tambra Muller M, PA-C  metroNIDAZOLE (FLAGYL) 500 MG tablet Take 1 tablet (500 mg total) by mouth 2 (two) times daily. 05/07/15   Sharman CheekStafford, Phillip, MD  ondansetron (ZOFRAN ODT) 8 MG disintegrating tablet Take 1 tablet (8 mg total) by mouth every 8 (eight) hours as needed for nausea or vomiting. 05/07/15   Sharman CheekStafford, Phillip, MD  oxyCODONE-acetaminophen (ROXICET) 5-325 MG tablet Take 1 tablet by mouth every 6 (six) hours as needed for severe pain. 05/07/15   Sharman CheekStafford, Phillip, MD    Allergies Morphine and related  No family history on file.  Social History Social History   Tobacco Use  . Smoking status:  Current Every Day Smoker  . Smokeless tobacco: Never Used  Substance Use Topics  . Alcohol use: No  . Drug use: No     Review of Systems  Constitutional: No fever/chills Eyes: No visual changes. No discharge ENT: No upper respiratory complaints. Cardiovascular: no chest pain. Respiratory: no cough. No SOB. Gastrointestinal: No abdominal pain.  No nausea, no vomiting.  No diarrhea.  No constipation. Genitourinary: Negative for dysuria. No hematuria Musculoskeletal: Patient has right foot pain.  Skin: Negative for rash, abrasions, lacerations, ecchymosis. Neurological: Negative for headaches, focal weakness or numbness.   ____________________________________________   PHYSICAL EXAM:  VITAL SIGNS: ED Triage Vitals  Enc Vitals Group     BP 12/22/16 1441 (!) 163/108     Pulse Rate 12/22/16 1441 (!) 101     Resp 12/22/16 1441 16     Temp 12/22/16 1441 98.5 F (36.9 C)     Temp Source 12/22/16 1441 Oral     SpO2 12/22/16 1441 98 %     Weight 12/22/16 1442 290 lb (131.5 kg)     Height 12/22/16 1442 5\' 10"  (1.778 m)     Head Circumference --      Peak Flow --      Pain Score 12/22/16 1441 7     Pain Loc --      Pain Edu? --      Excl. in [place]? --      Constitutional: Alert and oriented. Well appearing and in no acute distress. Eyes: Conjunctivae are normal.  PERRL. EOMI. Head: Atraumatic. Cardiovascular: Normal rate, regular rhythm. Normal S1 and S2.  Good peripheral circulation. Respiratory: Normal respiratory effort without tachypnea or retractions. Lungs CTAB. Good air entry to the bases with no decreased or absent breath sounds. Musculoskeletal: Patient has full range of motion at the right ankle, right knee and right hip.  She is able to move all 5 right toes.  Pain is elicited with palpation over the first through fifth metatarsals, right.  Palpable dorsalis pedis pulse bilaterally and symmetrically.  No pain was elicited with palpation of the calcaneus.  Neurologic:   Normal speech and language. No gross focal neurologic deficits are appreciated.  Skin:  Skin is warm, dry and intact. No rash noted. Psychiatric: Mood and affect are normal. Speech and behavior are normal. Patient exhibits appropriate insight and judgement.   ____________________________________________   LABS (all labs ordered are listed, but only abnormal results are displayed)  Labs Reviewed - No data to display ____________________________________________  EKG   ____________________________________________  RADIOLOGY Geraldo PitterI, Jonathon Castelo M Armanda Forand, personally viewed and evaluated these images (plain radiographs) as part of my medical decision making, as well as reviewing the written report by the radiologist.  Dg Foot Complete Right  Result Date: 12/22/2016 CLINICAL DATA:  Tripped over dog this am and injured right foot. Pain along dorsal surface. No prev injury. Shielded. EXAM: RIGHT FOOT COMPLETE - 3+ VIEW COMPARISON:  None. FINDINGS: No acute fracture or dislocation. Small plantar calcaneal spur is present. No radiopaque foreign body or soft tissue gas. IMPRESSION: No evidence for acute  abnormality. Electronically Signed   By: Norva PavlovElizabeth  Brown M.D.   On: 12/22/2016 15:32    ____________________________________________    PROCEDURES  Procedure(s) performed:    Procedures    Medications - No data to display   ____________________________________________   INITIAL IMPRESSION / ASSESSMENT AND PLAN / ED COURSE  Pertinent labs & imaging results that were available during my care of the patient were reviewed by me and considered in my medical decision making (see chart for details).  Review of the [place] CSRS was performed in accordance of the [facility] prior to dispensing any controlled drugs.     Assessment and plan Right foot pain Patient presents to the emergency department with right foot pain after tripping over her dog.  Differential diagnosis includes right ankle sprain  versus fracture versus contusion.  X-ray examination reveals no acute fractures or bony abnormalities.  Physical exam is reassuring.  Ace wrap was applied in the emergency department and patient was discharged with meloxicam.  Crutches were provided and patient was advised to follow-up with podiatry as needed.  All patient questions were answered.   ____________________________________________  FINAL CLINICAL IMPRESSION(S) / ED DIAGNOSES  Final diagnoses:  Contusion of right foot, initial encounter      NEW MEDICATIONS STARTED DURING THIS VISIT:  ED Discharge Orders        Ordered    meloxicam (MOBIC) 15 MG tablet  Daily     12/22/16 1602          This chart was dictated using voice recognition software/Dragon. Despite best efforts to proofread, errors can occur which can change the meaning. Any change was purely unintentional.    Orvil FeilWoods, Breeona Waid M, PA-C 12/22/16 1613    Jeanmarie PlantMcShane, James A, MD 12/22/16 1946

## 2016-12-22 NOTE — ED Triage Notes (Signed)
Tripped over dog this am and injured right foot.

## 2019-01-14 ENCOUNTER — Other Ambulatory Visit: Payer: Self-pay

## 2019-01-14 ENCOUNTER — Emergency Department: Payer: 59

## 2019-01-14 ENCOUNTER — Encounter: Payer: Self-pay | Admitting: Emergency Medicine

## 2019-01-14 ENCOUNTER — Emergency Department
Admission: EM | Admit: 2019-01-14 | Discharge: 2019-01-14 | Disposition: A | Discharge status: Home / Self Care | Payer: 59 | Attending: Emergency Medicine | Admitting: Emergency Medicine

## 2019-01-14 DIAGNOSIS — M545 Low back pain, unspecified: Secondary | ICD-10-CM

## 2019-01-14 DIAGNOSIS — R350 Frequency of micturition: Secondary | ICD-10-CM | POA: Insufficient documentation

## 2019-01-14 DIAGNOSIS — R109 Unspecified abdominal pain: Secondary | ICD-10-CM | POA: Diagnosis present

## 2019-01-14 DIAGNOSIS — F17210 Nicotine dependence, cigarettes, uncomplicated: Secondary | ICD-10-CM | POA: Insufficient documentation

## 2019-01-14 LAB — URINALYSIS, COMPLETE (UACMP) WITH MICROSCOPIC
Bacteria, UA: NONE SEEN
Bilirubin Urine: NEGATIVE
Glucose, UA: NEGATIVE mg/dL
Hgb urine dipstick: NEGATIVE
Ketones, ur: NEGATIVE mg/dL
Nitrite: NEGATIVE
Protein, ur: NEGATIVE mg/dL
Specific Gravity, Urine: 1.021 (ref 1.005–1.030)
pH: 5 (ref 5.0–8.0)

## 2019-01-14 LAB — CBC
HCT: 45.1 % (ref 36.0–46.0)
Hemoglobin: 14.9 g/dL (ref 12.0–15.0)
MCH: 29.3 pg (ref 26.0–34.0)
MCHC: 33 g/dL (ref 30.0–36.0)
MCV: 88.8 fL (ref 80.0–100.0)
Platelets: 321 10*3/uL (ref 150–400)
RBC: 5.08 MIL/uL (ref 3.87–5.11)
RDW: 12.5 % (ref 11.5–15.5)
WBC: 13.4 10*3/uL — ABNORMAL HIGH (ref 4.0–10.5)
nRBC: 0 % (ref 0.0–0.2)

## 2019-01-14 LAB — COMPREHENSIVE METABOLIC PANEL
ALT: 22 U/L (ref 0–44)
AST: 18 U/L (ref 15–41)
Albumin: 4.6 g/dL (ref 3.5–5.0)
Alkaline Phosphatase: 63 U/L (ref 38–126)
Anion gap: 14 (ref 5–15)
BUN: 17 mg/dL (ref 6–20)
CO2: 25 mmol/L (ref 22–32)
Calcium: 10.1 mg/dL (ref 8.9–10.3)
Chloride: 103 mmol/L (ref 98–111)
Creatinine, Ser: 0.81 mg/dL (ref 0.44–1.00)
GFR calc Af Amer: >60 mL/min (ref >60)
GFR calc non Af Amer: >60 mL/min (ref >60)
Glucose, Bld: 89 mg/dL (ref 70–99)
Potassium: 3.9 mmol/L (ref 3.5–5.1)
Sodium: 142 mmol/L (ref 135–145)
Total Bilirubin: 0.5 mg/dL (ref 0.3–1.2)
Total Protein: 8 g/dL (ref 6.5–8.1)

## 2019-01-14 LAB — POCT PREGNANCY, URINE: Preg Test, Ur: NEGATIVE

## 2019-01-14 LAB — LIPASE, BLOOD: Lipase: 26 U/L (ref 11–51)

## 2019-01-14 LAB — HCG, QUANTITATIVE, PREGNANCY: hCG, Beta Chain, Quant, S: 1 m[IU]/mL (ref <5)

## 2019-01-14 MED ORDER — HYDROMORPHONE HCL 1 MG/ML IJ SOLN
1.0000 mg | Freq: Once | INTRAMUSCULAR | Status: AC
  Administered 2019-01-14: 1 mg via INTRAVENOUS
  Filled 2019-01-14: qty 1

## 2019-01-14 MED ORDER — ONDANSETRON HCL 4 MG/2ML IJ SOLN
4.0000 mg | Freq: Once | INTRAMUSCULAR | Status: AC
  Administered 2019-01-14: 4 mg via INTRAVENOUS
  Filled 2019-01-14: qty 2

## 2019-01-14 MED ORDER — FENTANYL CITRATE (PF) 100 MCG/2ML IJ SOLN
50.0000 ug | Freq: Once | INTRAMUSCULAR | Status: AC
  Administered 2019-01-14: 50 ug via INTRAVENOUS
  Filled 2019-01-14: qty 2

## 2019-01-14 MED ORDER — OXYCODONE-ACETAMINOPHEN 5-325 MG PO TABS: 1.0000 | ORAL_TABLET | ORAL | 0 refills | Status: AC | PRN

## 2019-01-14 MED ORDER — SODIUM CHLORIDE 0.9 % IV BOLUS
1000.0000 mL | Freq: Once | INTRAVENOUS | Status: AC
  Administered 2019-01-14: 1000 mL via INTRAVENOUS

## 2019-01-14 MED ORDER — BACLOFEN 10 MG PO TABS: 10.0000 mg | ORAL_TABLET | Freq: Every day | ORAL | 1 refills | Status: AC

## 2019-01-14 NOTE — Discharge Instructions (Signed)
Follow-up with your regular doctor if not better in 3 days.  Return emergency department worsening.  Your CT today did not show fully moving kidney stone or appendicitis.  Also take over-the-counter Aleve, 2 pills twice daily.  Percocet should be reserved for severe pain.  Do not operate heavy machinery while using this medication.  Muscle relaxer as needed.

## 2019-01-14 NOTE — ED Triage Notes (Signed)
Pt c/o right flank pain and urinary frequency since last Wednesday. Pt seen at [facility] last Wednesday and diagnosed with kidney stone. Pt denies UC performed imaging. Pt was given flowmax, percocet with no relief.

## 2019-01-14 NOTE — ED Provider Notes (Signed)
[facility] Emergency Department Provider Note  ____________________________________________   First MD Initiated Contact with Patient 01/14/19 1958     (approximate)  I have reviewed the triage vital signs and the nursing notes.   HISTORY  Chief Complaint Flank Pain    HPI Diana Eaton is a 28 y.o. female presents emergency department complaining of right-sided flank pain.  States pain is worse with movement.  States that last Wednesday she had urinary frequency and right-sided flank pain.  Urgent care's been treating her for UTI and possible kidney stone.  They did not do any imaging.  Patient does have a history of kidney stones and states this does not feel the same.  She denies that she has had any fever or chills.  No chest pain or shortness of breath.    Past Medical History:  Diagnosis Date  . Neuropathy   . Sleep disturbance     There are no active problems to display for this patient.   History reviewed. No pertinent surgical history.  Prior to Admission medications   Medication Sig Start Date End Date Taking? Authorizing Provider  baclofen (LIORESAL) 10 MG tablet Take 1 tablet (10 mg total) by mouth daily. 01/14/19 01/14/20  Gwendolin Briel, Linden Dolin, PA-C  oxyCODONE-acetaminophen (PERCOCET) 5-325 MG tablet Take 1 tablet by mouth every 4 (four) hours as needed for severe pain. 01/14/19 01/14/20  Versie Starks, PA-C    Allergies Morphine and related  History reviewed. No pertinent family history.  Social History Social History   Tobacco Use  . Smoking status: Current Every Day Smoker  . Smokeless tobacco: Never Used  Substance Use Topics  . Alcohol use: No  . Drug use: No    Review of Systems  Constitutional: No fever/chills Eyes: No visual changes. ENT: No sore throat. Respiratory: Denies cough Gastrointestinal: Positive for right-sided flank pain Genitourinary: Negative for dysuria. Musculoskeletal: Positive for back  pain. Skin: Negative for rash.    ____________________________________________   PHYSICAL EXAM:  VITAL SIGNS: ED Triage Vitals [01/14/19 1929]  Enc Vitals Group     BP (!) 145/105     Pulse Rate (!) 112     Resp 18     Temp 98.9 F (37.2 C)     Temp Source Oral     SpO2 99 %     Weight      Height      Head Circumference      Peak Flow      Pain Score      Pain Loc      Pain Edu?      Excl. in [place]?     Constitutional: Alert and oriented. Well appearing and in no acute distress. Eyes: Conjunctivae are normal.  Head: Atraumatic. Nose: No congestion/rhinnorhea. Mouth/Throat: Mucous membranes are moist.   Neck:  supple no lymphadenopathy noted Cardiovascular: Normal rate, regular rhythm. Heart sounds are normal Respiratory: Normal respiratory effort.  No retractions, lungs c t a  Abd: soft tender in the right lower quadrant, bs normal all 4 quad GU: deferred Musculoskeletal: FROM all extremities, warm and well perfused, pain is reproduced with movement, lumbar spine is minimally tender, spasms noted on the right side along with flank Neurologic:  Normal speech and language.  Skin:  Skin is warm, dry and intact. No rash noted. Psychiatric: Mood and affect are normal. Speech and behavior are normal.  ____________________________________________   LABS (all labs ordered are listed, but only abnormal results are  displayed)  Labs Reviewed  CBC - Abnormal; Notable for the following components:      Result Value   WBC 13.4 (*)    All other components within normal limits  URINALYSIS, COMPLETE (UACMP) WITH MICROSCOPIC - Abnormal; Notable for the following components:   Color, Urine YELLOW (*)    APPearance HAZY (*)    Leukocytes,Ua TRACE (*)    All other components within normal limits  LIPASE, BLOOD  COMPREHENSIVE METABOLIC PANEL  HCG, QUANTITATIVE, PREGNANCY  POC URINE PREG, ED  POCT PREGNANCY, URINE   ____________________________________________    ____________________________________________  RADIOLOGY  CT renal stone shows a 2 mm stone in the right lower pole of the kidney.  Appendix is normal no pyelonephritis noted ____________________________________________   PROCEDURES  Procedure(s) performed: Fentanyl 50 mcg IV, normal saline 1 L IV, Dilaudid 1 mg, Zofran 4 mg  Procedures    ____________________________________________   INITIAL IMPRESSION / ASSESSMENT AND PLAN / ED COURSE  Pertinent labs & imaging results that were available during my care of the patient were reviewed by me and considered in my medical decision making (see chart for details).   Patient is 28 year old female presents emergency department with right-sided flank pain.  See HPI in addition to the HPI patient is on fertility medicine trying to conceive.  Therefore in addition to the poc preg I will order a beta prior to CT   Physical exam shows the patient to appear quite uncomfortable, some tenderness in the lower back area, tenderness in the right lower quadrant area  CBC is elevated at 13.4, urinalysis only has trace of leuks, comprehensive metabolic panel is normal, POC pregnancy is negative, lipase is normal  Saline lock, normal saline 1 L IV, fentanyl 50 mcg IV CT renal stone if beta-hCG is negative  CT is negative  Explained findings to the patient.  She was still uncomfortable after the fentanyl so I gave her 1 mg Dilaudid and 4 of Zofran.  She states this medication helps a lot.  I feel that most of this pain she is feeling is musculoskeletal.  She tends to wince with movement.  She was given a prescription for baclofen and Percocet.  She is taking over-the-counter Aleve.  Return to the emergency department if worsening.  States she understands will comply.  She is discharged in stable condition.  Diana Eaton was evaluated in Emergency Department on 01/14/2019 for the symptoms described in the history of present illness. She was evaluated  in the context of the global COVID-19 pandemic, which necessitated consideration that the patient might be at risk for infection with the SARS-CoV-2 virus that causes COVID-19. Institutional protocols and algorithms that pertain to the evaluation of patients at risk for COVID-19 are in a state of rapid change based on information released by regulatory bodies including the CDC and federal and state organizations. These policies and algorithms were followed during the patient's care in the ED.   As part of my medical decision making, I reviewed the following data within the electronic MEDICAL RECORD NUMBER Nursing notes reviewed and incorporated, Labs reviewed , Old chart reviewed, Radiograph reviewed , Notes from prior ED visits and [place] Controlled Substance Database  ____________________________________________   FINAL CLINICAL IMPRESSION(S) / ED DIAGNOSES  Final diagnoses:  Acute right-sided low back pain without sciatica      NEW MEDICATIONS STARTED DURING THIS VISIT:  Discharge Medication List as of 01/14/2019 10:16 PM    START taking these medications   Details  baclofen (LIORESAL) 10 MG tablet Take 1 tablet (10 mg total) by mouth daily., Starting Mon 01/14/2019, Until Tue 01/14/2020, Normal         Note:  This document was prepared using Dragon voice recognition software and may include unintentional dictation errors.    Faythe Ghee, PA-C 01/14/19 2310    Chesley Noon, MD 01/17/19 [phone]

## 2019-01-14 NOTE — ED Notes (Signed)
Pt having trouble ambulating to treatment bed and reports she is having increased pain with standing and ambulating.

## 2021-07-22 IMAGING — CT CT RENAL STONE PROTOCOL
2 of 4 series · 16 of 46 positions shown, 18 images · non-contrast
Comparison: May 08, 2015

CLINICAL DATA: Right-sided flank pain and urinary frequency

EXAM:
CT ABDOMEN AND PELVIS WITHOUT CONTRAST
TECHNIQUE: Multidetector CT imaging of the abdomen and pelvis was performed
following the standard protocol without IV contrast.

[Series 4: stone full standard · axial · 0.87mm/px · z∈[-916,-446]mm · 13 of 104 slices shown, 15 images]
[im 5/104  soft-tissue]
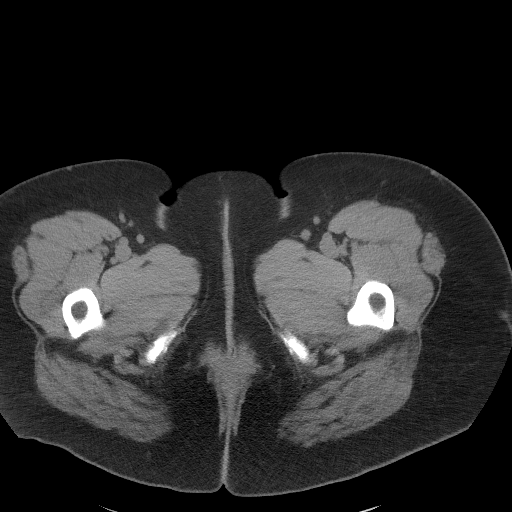
[im 5/104  bone]
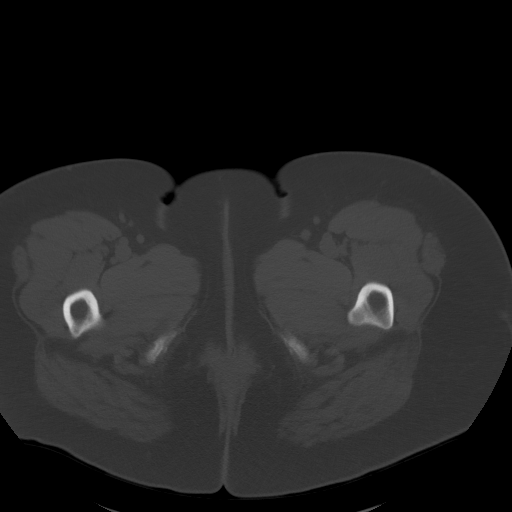
[im 13/104  soft-tissue]
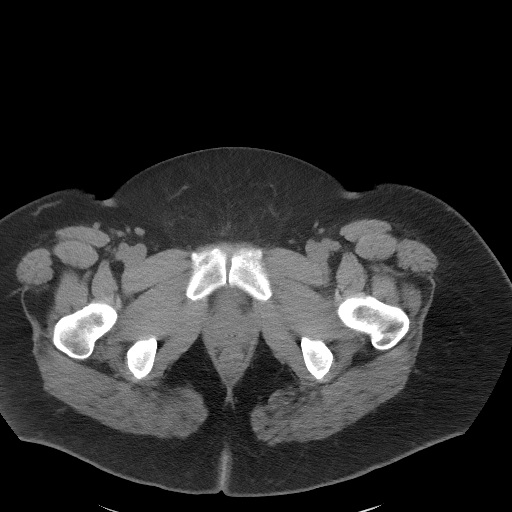
[im 22/104  soft-tissue]
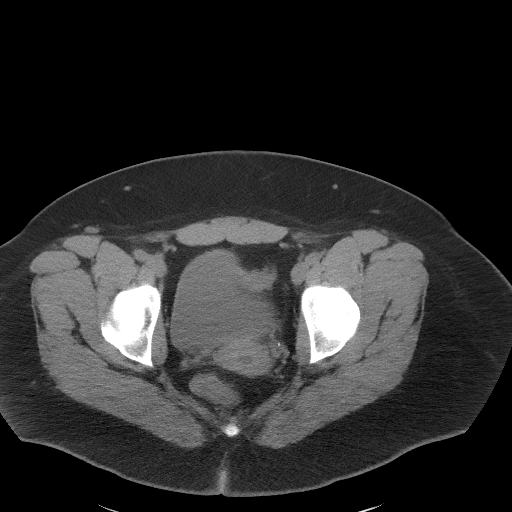
[im 31/104  soft-tissue]
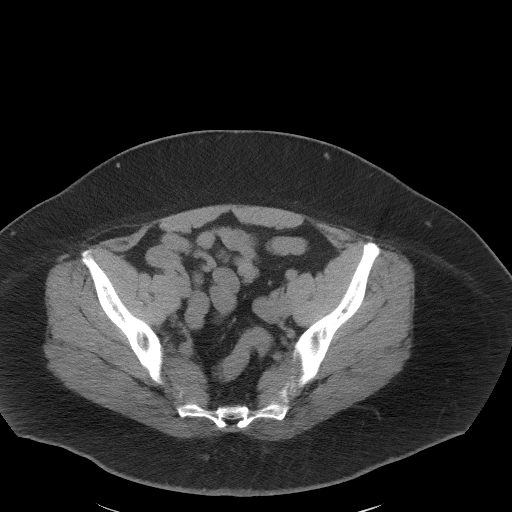
[im 35/104  soft-tissue]
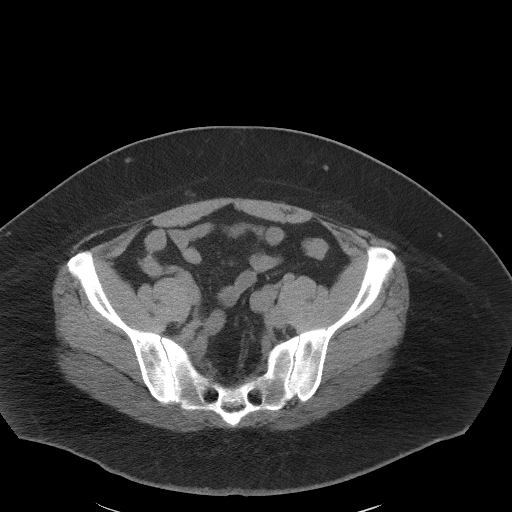
[im 43/104  soft-tissue]
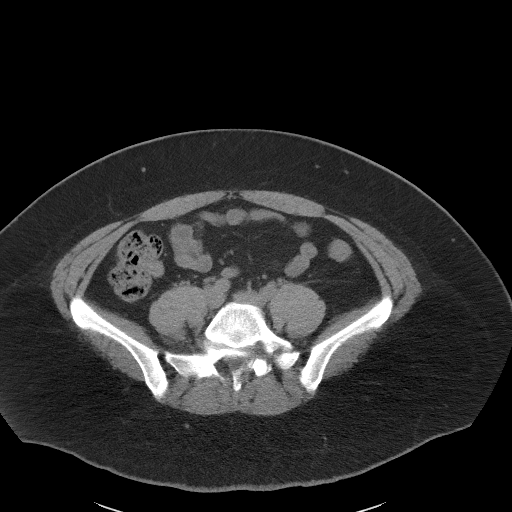
[im 52/104  soft-tissue]
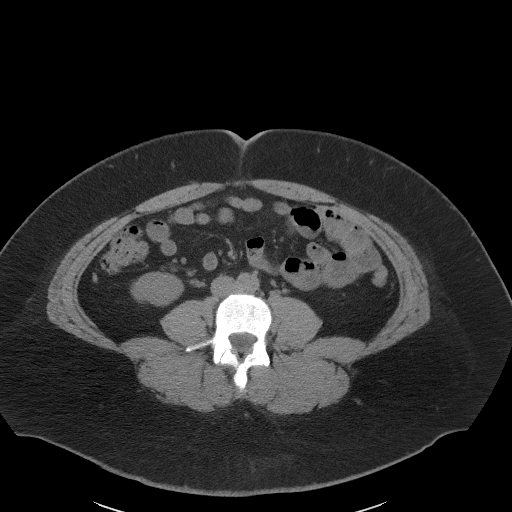
[im 61/104  soft-tissue]
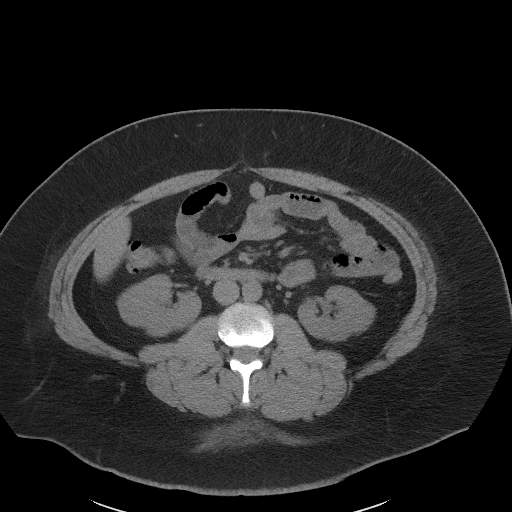
[im 69/104  soft-tissue]
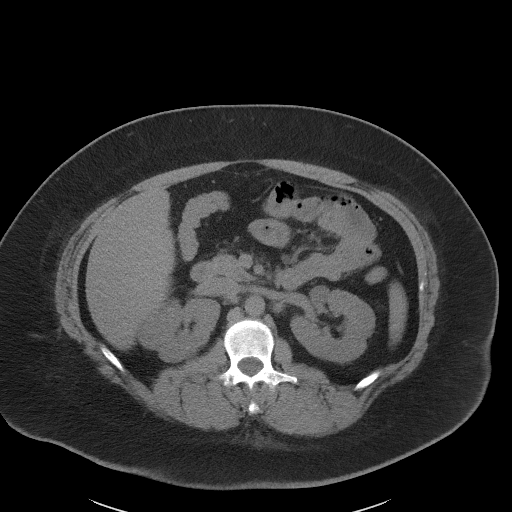
[im 69/104  bone]
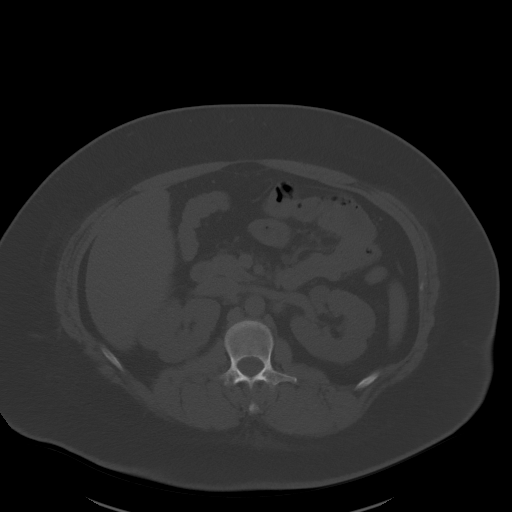
[im 73/104  soft-tissue]
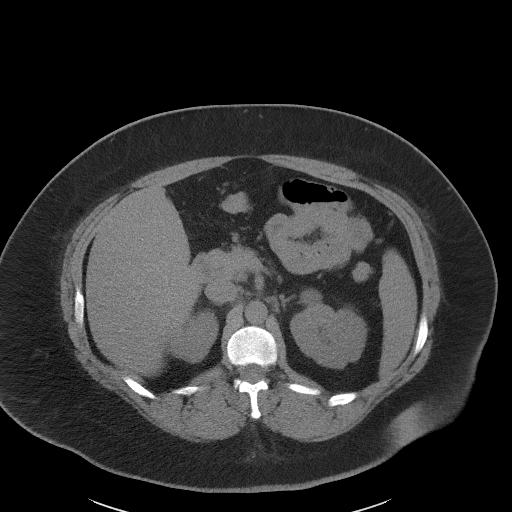
[im 82/104  soft-tissue]
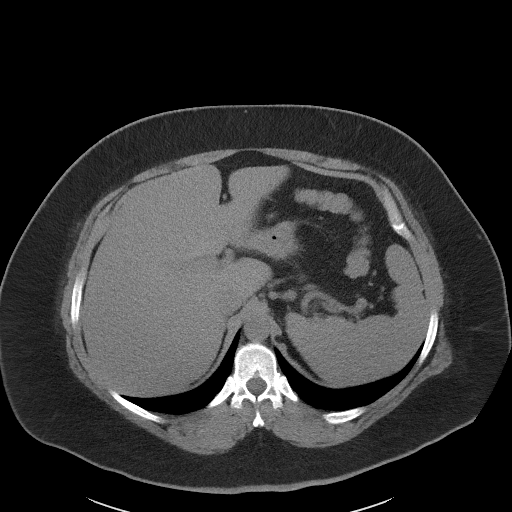
[im 91/104  soft-tissue]
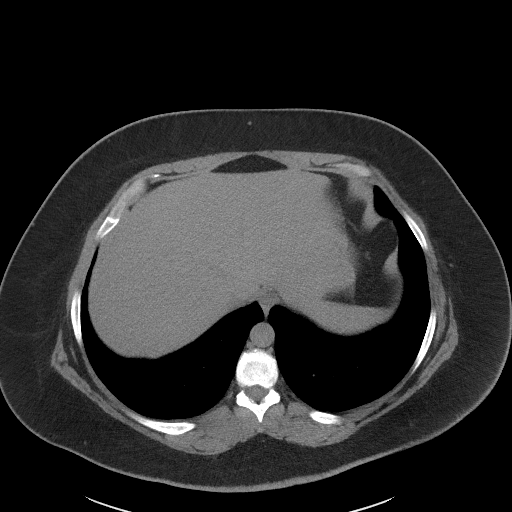
[im 99/104  soft-tissue]
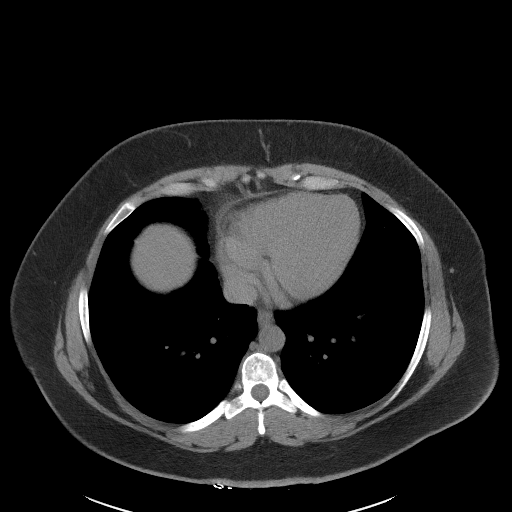

[Series 5: coronal · coronal · 0.87mm/px · 3 of 162 slices shown]
[im 54/162  soft-tissue]
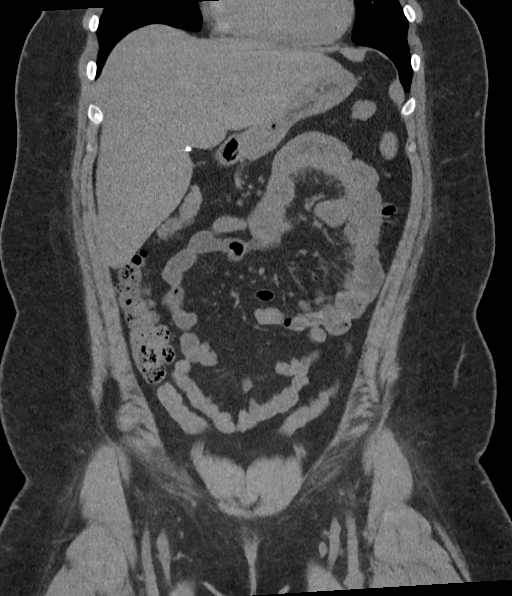
[im 72/162  soft-tissue]
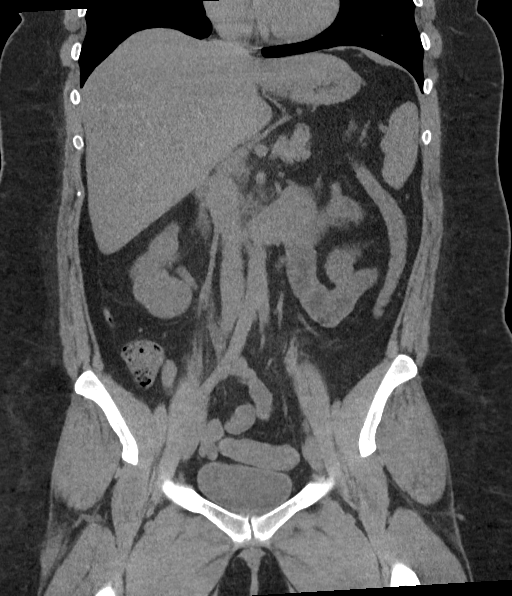
[im 90/162  soft-tissue]
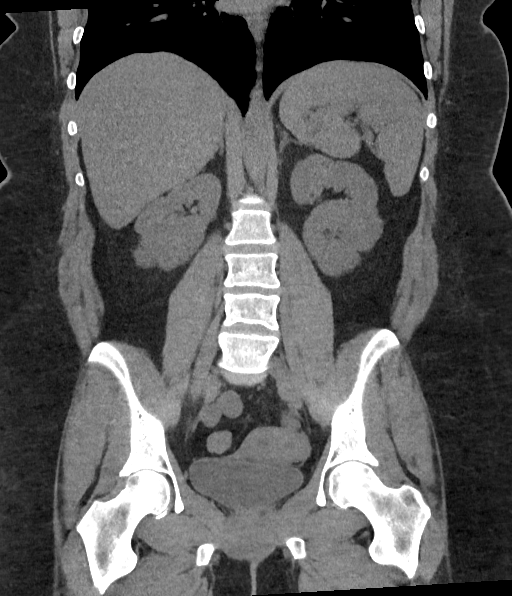

[16 of 46 positions shown; findings below may reference images not displayed]

FINDINGS: Lower chest: The visualized heart size within normal limits. No
pericardial fluid/thickening.

No hiatal hernia.

The visualized portions of the lungs are clear.

Hepatobiliary: Although limited due to the lack of intravenous
contrast, normal in appearance without gross focal abnormality. The
patient is status post cholecystectomy. No biliary ductal dilation.

Pancreas:  Unremarkable.  No surrounding inflammatory changes.

Spleen: Normal in size. Although limited due to the lack of
intravenous contrast, normal in appearance.

Adrenals/Urinary Tract: Both adrenal glands appear normal. There is
a punctate 2 mm calculus seen in the lower pole of the right kidney.
Again noted is lobular appearance to the bilateral kidneys. No other
renal, collecting system, or bladder calculi are noted.

Stomach/Bowel: The stomach, small bowel, and colon are normal in
appearance. No inflammatory changes or obstructive findings.
appendix is normal.

Vascular/Lymphatic: There are no enlarged abdominal or pelvic lymph
nodes. No significant gross vascular findings are present.

Reproductive: The uterus and adnexa are unremarkable.

Other: No evidence of abdominal wall mass or hernia.

Musculoskeletal: No acute or significant osseous findings. Bilateral
chronic pars defects seen at L5.
IMPRESSION: Punctate nonobstructing 2 mm right-sided renal calculi. No other
renal, collecting system, bladder calculi.

## 2021-11-26 ENCOUNTER — Emergency Department
Admission: EM | Admit: 2021-11-26 | Discharge: 2021-11-26 | Disposition: A | Discharge status: Home / Self Care | Payer: 59 | Attending: Emergency Medicine | Admitting: Emergency Medicine

## 2021-11-26 ENCOUNTER — Emergency Department: Payer: 59

## 2021-11-26 DIAGNOSIS — S39012A Strain of muscle, fascia and tendon of lower back, initial encounter: Secondary | ICD-10-CM | POA: Diagnosis not present

## 2021-11-26 DIAGNOSIS — S3992XA Unspecified injury of lower back, initial encounter: Secondary | ICD-10-CM | POA: Diagnosis present

## 2021-11-26 DIAGNOSIS — X58XXXA Exposure to other specified factors, initial encounter: Secondary | ICD-10-CM | POA: Insufficient documentation

## 2021-11-26 LAB — URINALYSIS, ROUTINE W REFLEX MICROSCOPIC
Bilirubin Urine: NEGATIVE
Glucose, UA: NEGATIVE mg/dL
Ketones, ur: NEGATIVE mg/dL
Leukocytes,Ua: NEGATIVE
Nitrite: NEGATIVE
Protein, ur: NEGATIVE mg/dL
Specific Gravity, Urine: 1.027 (ref 1.005–1.030)
pH: 5 (ref 5.0–8.0)

## 2021-11-26 LAB — BASIC METABOLIC PANEL
Anion gap: 8 (ref 5–15)
BUN: 13 mg/dL (ref 6–20)
CO2: 28 mmol/L (ref 22–32)
Calcium: 9 mg/dL (ref 8.9–10.3)
Chloride: 104 mmol/L (ref 98–111)
Creatinine, Ser: 0.78 mg/dL (ref 0.44–1.00)
GFR, Estimated: >60 mL/min (ref >60)
Glucose, Bld: 112 mg/dL — ABNORMAL HIGH (ref 70–99)
Potassium: 3.4 mmol/L — ABNORMAL LOW (ref 3.5–5.1)
Sodium: 140 mmol/L (ref 135–145)

## 2021-11-26 LAB — CBC
HCT: 43.5 % (ref 36.0–46.0)
Hemoglobin: 14.5 g/dL (ref 12.0–15.0)
MCH: 29.5 pg (ref 26.0–34.0)
MCHC: 33.3 g/dL (ref 30.0–36.0)
MCV: 88.4 fL (ref 80.0–100.0)
Platelets: 297 10*3/uL (ref 150–400)
RBC: 4.92 MIL/uL (ref 3.87–5.11)
RDW: 12.7 % (ref 11.5–15.5)
WBC: 9.4 10*3/uL (ref 4.0–10.5)
nRBC: 0 % (ref 0.0–0.2)

## 2021-11-26 LAB — POC URINE PREG, ED: Preg Test, Ur: NEGATIVE

## 2021-11-26 MED ORDER — IBUPROFEN 800 MG PO TABS: 800.0000 mg | ORAL_TABLET | Freq: Three times a day (TID) | ORAL | 0 refills | Status: AC | PRN

## 2021-11-26 MED ORDER — METHOCARBAMOL 500 MG PO TABS
500.0000 mg | ORAL_TABLET | Freq: Once | ORAL | Status: AC
  Administered 2021-11-26: 500 mg via ORAL
  Filled 2021-11-26: qty 1

## 2021-11-26 MED ORDER — OXYCODONE-ACETAMINOPHEN 5-325 MG PO TABS
1.0000 | ORAL_TABLET | Freq: Once | ORAL | Status: AC
  Administered 2021-11-26: 1 via ORAL
  Filled 2021-11-26: qty 1

## 2021-11-26 MED ORDER — IBUPROFEN 800 MG PO TABS
800.0000 mg | ORAL_TABLET | Freq: Once | ORAL | Status: AC
  Administered 2021-11-26: 800 mg via ORAL
  Filled 2021-11-26: qty 1

## 2021-11-26 MED ORDER — METHOCARBAMOL 500 MG PO TABS: 500.0000 mg | ORAL_TABLET | Freq: Three times a day (TID) | ORAL | 0 refills | Status: AC | PRN

## 2021-11-26 MED ORDER — LIDOCAINE 5 % EX PTCH: 1.0000 | MEDICATED_PATCH | CUTANEOUS | 0 refills | Status: AC

## 2021-11-26 MED ORDER — ONDANSETRON 4 MG PO TBDP
4.0000 mg | ORAL_TABLET | Freq: Once | ORAL | Status: AC
  Administered 2021-11-26: 4 mg via ORAL
  Filled 2021-11-26: qty 1

## 2021-11-26 MED ORDER — LIDOCAINE 5 % EX PTCH
1.0000 | MEDICATED_PATCH | Freq: Once | CUTANEOUS | Status: DC
  Administered 2021-11-26: 1 via TRANSDERMAL
  Filled 2021-11-26: qty 1

## 2021-11-26 NOTE — ED Provider Notes (Signed)
[facility] Provider Note    Event Date/Time   First MD Initiated Contact with Patient 11/26/21 [phone]     (approximate)   History   Flank Pain   HPI  Diana Eaton is a 31 y.o. female with history of obesity who presents to the emergency department complaints of right lower back pain worse with movement.  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  No numbness, tingling, weakness, bowel or bladder incontinence, urinary retention, fever.  No history of HIV, diabetes, malignancy, IV drug abuse.  No previous back surgery or epidural injection.  No urinary symptoms.    History provided by patient.    Past Medical History:  Diagnosis Date   Neuropathy    Sleep disturbance     No past surgical history on file.  MEDICATIONS:  Prior to Admission medications   Medication Sig Start Date End Date Taking? Authorizing Provider  ibuprofen (ADVIL) 800 MG tablet Take 1 tablet (800 mg total) by mouth every 8 (eight) hours as needed. 11/26/21  Yes Raif Chachere N, DO  lidocaine (LIDODERM) 5 % Place 1 patch onto the skin daily. Remove & Discard patch within 12 hours or as directed by MD 11/26/21  Yes Kenzi Bardwell, Delice Bison, DO  methocarbamol (ROBAXIN) 500 MG tablet Take 1 tablet (500 mg total) by mouth every 8 (eight) hours as needed for muscle spasms. 11/26/21  Yes Chenoah Mcnally, Delice Bison, DO    Physical Exam   Triage Vital Signs: ED Triage Vitals  Enc Vitals Group     BP 11/26/21 0139 (!) 170/78     Pulse Rate 11/26/21 0139 77     Resp 11/26/21 0139 20     Temp 11/26/21 0139 98.3 F (36.8 C)     Temp src --      SpO2 11/26/21 0139 98 %     Weight 11/26/21 0130 295 lb (133.8 kg)     Height 11/26/21 0130 5\' 10"  (1.778 m)     Head Circumference --      Peak Flow --      Pain Score 11/26/21 0130 8     Pain Loc --      Pain Edu? --      Excl. in [place]? --     Most recent vital signs: Vitals:   11/26/21 0139 11/26/21 0400  BP: (!) 170/78 (!)  137/100  Pulse: 77 98  Resp: 20   Temp: 98.3 F (36.8 C)   SpO2: 98% 100%    CONSTITUTIONAL: Alert and oriented and responds appropriately to questions. Well-appearing; well-nourished HEAD: Normocephalic, atraumatic EYES: Conjunctivae clear, pupils appear equal, sclera nonicteric ENT: normal nose; moist mucous membranes NECK: Supple, normal ROM CARD: RRR; S1 and S2 appreciated; no murmurs, no clicks, no rubs, no gallops RESP: Normal chest excursion without splinting or tachypnea; breath sounds clear and equal bilaterally; no wheezes, no rhonchi, no rales, no hypoxia or respiratory distress, speaking full sentences ABD/GI: Normal bowel sounds; non-distended; soft, non-tender, no rebound, no guarding, no peritoneal signs BACK: The back appears normal, mild tenderness over the right lumbar paraspinal muscles without ecchymosis, redness, warmth, rash or other lesions.  No midline spinal tenderness or step-off or deformity. EXT: Normal ROM in all joints; no deformity noted, no edema; no cyanosis SKIN: Normal color for age and race; warm; no rash on exposed skin NEURO: Moves all extremities equally, normal speech, normal sensation diffusely, no saddle anesthesia, no clonus, ambulates with normal  gait PSYCH: The patient's mood and manner are appropriate.   ED Results / Procedures / Treatments   LABS: (all labs ordered are listed, but only abnormal results are displayed) Labs Reviewed  URINALYSIS, ROUTINE W REFLEX MICROSCOPIC - Abnormal; Notable for the following components:      Result Value   Color, Urine YELLOW (*)    APPearance HAZY (*)    Hgb urine dipstick MODERATE (*)    Bacteria, UA RARE (*)    All other components within normal limits  BASIC METABOLIC PANEL - Abnormal; Notable for the following components:   Potassium 3.4 (*)    Glucose, Bld 112 (*)    All other components within normal limits  CBC  POC URINE PREG, ED     EKG:  EKG Interpretation  Date/Time:     Ventricular Rate:    PR Interval:    QRS Duration:   QT Interval:    QTC Calculation:   R Axis:     Text Interpretation:           RADIOLOGY: My personal review and interpretation of imaging: CT scan shows no ureterolithiasis or other acute abnormality.  I have personally reviewed all radiology reports.   CT Renal Stone Study  Result Date: 11/26/2021 CLINICAL DATA:  Right flank pain starting 3 days ago. Kidney stones suspected. EXAM: CT ABDOMEN AND PELVIS WITHOUT CONTRAST TECHNIQUE: Multidetector CT imaging of the abdomen and pelvis was performed following the standard protocol without IV contrast. RADIATION DOSE REDUCTION: This exam was performed according to the departmental dose-optimization program which includes automated exposure control, adjustment of the mA and/or kV according to patient size and/or use of iterative reconstruction technique. COMPARISON:  01/14/2019 FINDINGS: Lower chest: Lung bases are clear. Hepatobiliary: No focal liver abnormality is seen. Status post cholecystectomy. No biliary dilatation. Pancreas: Unremarkable. No pancreatic ductal dilatation or surrounding inflammatory changes. Spleen: Normal in size without focal abnormality. Adrenals/Urinary Tract: No adrenal gland nodules. Intrarenal stone in the left mid kidney measuring 6 mm diameter. No hydronephrosis or hydroureter. No ureteral stones. Cysts on the left kidney measuring up to 2.6 cm diameter. No imaging follow-up is indicated. No change since prior study. Stomach/Bowel: Stomach, small bowel, and colon are not abnormally distended. No wall thickening or inflammatory changes are seen. Appendix is normal. Vascular/Lymphatic: No significant vascular findings are present. No enlarged abdominal or pelvic lymph nodes. Reproductive: Uterus and bilateral adnexa are unremarkable. Other: No free air or free fluid in the abdomen. Abdominal wall musculature appears intact. 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 No acute bony abnormalities. IMPRESSION: 1. Nonobstructing stone in the left kidney. No ureteral stone or obstruction. 2. No acute process demonstrated in the abdomen or pelvis. No evidence of bowel obstruction or inflammation. Electronically Signed   By: Burman Nieves M.D.   On: 11/26/2021 03:22     PROCEDURES:  Critical Care performed: No     Procedures    IMPRESSION / MDM / ASSESSMENT AND PLAN / ED COURSE  I reviewed the triage vital signs and the nursing notes.    Patient here with complaints of lower back pain worse with movement.  No focal neurologic deficits.  No infectious symptoms.  No red flags.    DIFFERENTIAL DIAGNOSIS (includes but not limited to):   Suspect lumbar strain, muscle spasm.  Low suspicion for fracture, cauda equina, epidural abscess or hematoma, discitis or osteomyelitis, transverse myelitis.  Kidney stones, UTI, pyelonephritis on the differential.   Patient's presentation is  most consistent with acute presentation with potential threat to life or bodily function.   PLAN: Patient's labs show no leukocytosis.  Normal hemoglobin.  Normal electrolytes and renal function.  Urine does not appear infected today.  Pregnancy test negative.  CT of abdomen pelvis reviewed and interpreted by myself and the radiologist and shows no acute abnormality.  She has a stone within the left kidney but no ureterolithiasis, hydronephrosis.  Appendix is normal.  This seems more likely to be muscle strain, spasm.  Will discharge with anti-inflammatories, muscle relaxers and Lidoderm patches.  Recommended alternating heat and ice, stretching, rest.  She has a PCP for follow-up if symptoms not improving.   MEDICATIONS GIVEN IN ED: Medications  ondansetron (ZOFRAN-ODT) disintegrating tablet 4 mg (4 mg Oral Given 11/26/21 0138)  oxyCODONE-acetaminophen (PERCOCET/ROXICET) 5-325 MG per tablet 1 tablet (1 tablet Oral Given 11/26/21 0138)   ibuprofen (ADVIL) tablet 800 mg (800 mg Oral Given 11/26/21 0435)  methocarbamol (ROBAXIN) tablet 500 mg (500 mg Oral Given 11/26/21 0435)     ED COURSE:  At this time, I do not feel there is any life-threatening condition present. I reviewed all nursing notes, vitals, pertinent previous records.  All lab and urine results, EKGs, imaging ordered have been independently reviewed and interpreted by myself.  I reviewed all available radiology reports from any imaging ordered this visit.  Based on my assessment, I feel the patient is safe to be discharged home without further emergent workup and can continue workup as an outpatient as needed. Discussed all findings, treatment plan as well as usual and customary return precautions.  They verbalize understanding and are comfortable with this plan.  Outpatient follow-up has been provided as needed.  All questions have been answered.    CONSULTS:  No admission required at this time for uncomplicated lumbar strain without red flags.  Neurologically intact and afebrile.   OUTSIDE RECORDS REVIEWED: Reviewed patient's last office visit with OB/GYN on 04/21/2021.       FINAL CLINICAL IMPRESSION(S) / ED DIAGNOSES   Final diagnoses:  Strain of lumbar region, initial encounter     Rx / DC Orders   ED Discharge Orders          Ordered    ibuprofen (ADVIL) 800 MG tablet  Every 8 hours PRN        11/26/21 0425    methocarbamol (ROBAXIN) 500 MG tablet  Every 8 hours PRN        11/26/21 0425    lidocaine (LIDODERM) 5 %  Every 24 hours        11/26/21 0425             Note:  This document was prepared using Dragon voice recognition software and may include unintentional dictation errors.   Madalin Hughart, Layla Maw, DO 11/26/21 1141

## 2021-11-26 NOTE — Discharge Instructions (Addendum)
You may alternate Tylenol 1000 mg every 6 hours as needed for pain, fever and Ibuprofen 800 mg every 6-8 hours as needed for pain, fever.  Please take Ibuprofen with food.  Do not take more than 4000 mg of Tylenol (acetaminophen) in a 24 hour period. ° °

## 2021-11-26 NOTE — ED Triage Notes (Signed)
Pt presents via POV with complaints of R flank pain that started 2-3 days ago. Endorses nausea but no vomiting. Hx of kidney stone 10 years ago. Denies fevers, urinary sx, CP or SOB.
